# Patient Record
Sex: Male | Born: 1965 | Race: Black or African American | Hispanic: No | Marital: Single | Smoking: Current every day smoker
Health system: Southern US, Community
[De-identification: ages and names within clinical notes are randomized; demographics above are authoritative.]

---

## 2019-09-20 ENCOUNTER — Ambulatory Visit
Admission: EM | Admit: 2019-09-20 | Discharge: 2019-09-20 | Disposition: A | Discharge status: Home / Self Care | Payer: Self-pay | Attending: Physician Assistant | Admitting: Physician Assistant

## 2019-09-20 ENCOUNTER — Ambulatory Visit (INDEPENDENT_AMBULATORY_CARE_PROVIDER_SITE_OTHER): Payer: Self-pay

## 2019-09-20 DIAGNOSIS — M79672 Pain in left foot: Secondary | ICD-10-CM

## 2019-09-20 DIAGNOSIS — M545 Low back pain, unspecified: Secondary | ICD-10-CM

## 2019-09-20 DIAGNOSIS — M542 Cervicalgia: Secondary | ICD-10-CM

## 2019-09-20 LAB — POCT URINALYSIS DIP (MANUAL ENTRY)
Bilirubin, UA: NEGATIVE
Glucose, UA: NEGATIVE mg/dL
Leukocytes, UA: NEGATIVE
Nitrite, UA: NEGATIVE
Protein Ur, POC: NEGATIVE mg/dL
Spec Grav, UA: >=1.03 — AB (ref 1.010–1.025)
Urobilinogen, UA: 1 E.U./dL
pH, UA: 7 (ref 5.0–8.0)

## 2019-09-20 MED ORDER — MELOXICAM 7.5 MG PO TABS: 7.5000 mg | ORAL_TABLET | Freq: Every day | ORAL | 0 refills | Status: DC

## 2019-09-20 MED ORDER — METHOCARBAMOL 500 MG PO TABS: 500.0000 mg | ORAL_TABLET | Freq: Two times a day (BID) | ORAL | 0 refills | Status: DC
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
## 2020-03-06 ENCOUNTER — Other Ambulatory Visit: Payer: Self-pay

## 2020-03-06 ENCOUNTER — Ambulatory Visit (INDEPENDENT_AMBULATORY_CARE_PROVIDER_SITE_OTHER): Payer: Self-pay

## 2020-03-06 ENCOUNTER — Ambulatory Visit
Admission: EM | Admit: 2020-03-06 | Discharge: 2020-03-06 | Disposition: A | Discharge status: Home / Self Care | Payer: Self-pay | Attending: Physician Assistant | Admitting: Physician Assistant

## 2020-03-06 DIAGNOSIS — M25562 Pain in left knee: Secondary | ICD-10-CM

## 2020-03-06 DIAGNOSIS — S83412A Sprain of medial collateral ligament of left knee, initial encounter: Secondary | ICD-10-CM

## 2020-03-06 MED ORDER — DICLOFENAC SODIUM 75 MG PO TBEC: 75.0000 mg | DELAYED_RELEASE_TABLET | Freq: Two times a day (BID) | ORAL | 0 refills | Status: AC

## 2020-03-06 NOTE — ED Provider Notes (Signed)
[facility]    CSN: [id number] Arrival date & time: 03/06/20  1206      History   Chief Complaint Chief Complaint  Patient presents with  . Knee Pain    HPI Gregory Conrad is a 54 y.o. male.   The history is provided by the patient. No language interpreter was used.  Knee Pain Location:  Knee Time since incident:  2 days Injury: no   Knee location:  L knee Pain details:    Quality:  Aching   Radiates to:  Does not radiate   Severity:  Moderate   Onset quality:  Gradual   Timing:  Constant   Progression:  Worsening Chronicity:  New Dislocation: no   Foreign body present:  No foreign bodies Prior injury to area:  Yes Relieved by:  Nothing Worsened by:  Nothing Ineffective treatments:  None tried Pt reports pain in knee after lifting and turning at work    History reviewed. No pertinent past medical history.  There are no problems to display for this patient.   History reviewed. No pertinent surgical history.     Home Medications    Prior to Admission medications   Medication Sig Start Date End Date Taking? Authorizing Provider  diclofenac (VOLTAREN) 75 MG EC tablet Take 1 tablet (75 mg total) by mouth 2 (two) times daily. 03/06/20   Elson Areas, PA-C    Family History No family history on file.  Social History Social History   Tobacco Use  . Smoking status: Current Every Day Smoker  . Smokeless tobacco: Never Used  Substance Use Topics  . Alcohol use: Yes  . Drug use: Not Currently     Allergies   Patient has no known allergies.   Review of Systems Review of Systems  All other systems reviewed and are negative.    Physical Exam Triage Vital Signs ED Triage Vitals  Enc Vitals Group     BP 03/06/20 1241 139/78     Pulse Rate 03/06/20 1241 60     Resp 03/06/20 1241 16     Temp 03/06/20 1241 98.1 F (36.7 C)     Temp Source 03/06/20 1241 Oral     SpO2 03/06/20 1241 96 %     Weight --      Height --      Head  Circumference --      Peak Flow --      Pain Score 03/06/20 1256 9     Pain Loc --      Pain Edu? --      Excl. in [place]? --    No data found.  Updated Vital Signs BP 139/78 (BP Location: Left Arm)   Pulse 60   Temp 98.1 F (36.7 C) (Oral)   Resp 16   SpO2 96%   Visual Acuity Right Eye Distance:   Left Eye Distance:   Bilateral Distance:    Right Eye Near:   Left Eye Near:    Bilateral Near:     Physical Exam Vitals and nursing note reviewed.  Constitutional:      Appearance: He is well-developed.  HENT:     Head: Normocephalic and atraumatic.  Eyes:     Conjunctiva/sclera: Conjunctivae normal.  Cardiovascular:     Rate and Rhythm: Normal rate.  Pulmonary:     Effort: Pulmonary effort is normal.  Abdominal:     Tenderness: There is no abdominal tenderness.  Musculoskeletal:  General: Swelling and tenderness present.     Cervical back: Neck supple.     Comments: No medial or lateral instability  Negative drawer,  Tender medial joint line  Skin:    General: Skin is warm and dry.  Neurological:     General: No focal deficit present.     Mental Status: He is alert.  Psychiatric:        Mood and Affect: Mood normal.      UC Treatments / Results  Labs (all labs ordered are listed, but only abnormal results are displayed) Labs Reviewed - No data to display  EKG   Radiology DG Knee Complete 4 Views Left  Result Date: 03/06/2020 CLINICAL DATA:  Recent work injury.  Left knee pain. EXAM: LEFT KNEE - COMPLETE 4+ VIEW COMPARISON:  None. FINDINGS: The joint spaces are maintained. No acute fracture. No osteochondral lesion. No chondrocalcinosis. No definite joint effusion. IMPRESSION: No acute bony findings or significant degenerative changes. Electronically Signed   By: Marijo Sanes M.D.   On: 03/06/2020 14:08    Procedures Procedures (including critical care time)  Medications Ordered in UC Medications - No data to display  Initial Impression /  Assessment and Plan / UC Course  I have reviewed the triage vital signs and the nursing notes.  Pertinent labs & imaging results that were available during my care of the patient were reviewed by me and considered in my medical decision making (see chart for details).     MDM:  Xray no fracture.  Pt placed in a knee sleeve.  Pt advised to follow up with Orthopaedist for recheck  Final Clinical Impressions(s) / UC Diagnoses   Final diagnoses:  Sprain of medial collateral ligament of left knee, initial encounter     Discharge Instructions     Schedule to see the Orthopaedist for evaluation,  Wear knee brace.  Ice to area of swelling    ED Prescriptions    Medication Sig Dispense Auth. Provider   diclofenac (VOLTAREN) 75 MG EC tablet Take 1 tablet (75 mg total) by mouth 2 (two) times daily. 20 tablet Fransico Meadow, [place]     PDMP not reviewed this encounter.  An After Visit Summary was printed and given to the patient.    Fransico Meadow, [place] 03/06/20 1436

## 2020-03-06 NOTE — Discharge Instructions (Signed)
Schedule to see the Orthopaedist for evaluation,  Wear knee brace.  Ice to area of swelling

## 2020-03-06 NOTE — ED Triage Notes (Signed)
Pt states at work on Monday and stepped wrong and twisted lt ankle. C/o lt ankle swelling and pain.

## 2021-03-20 IMAGING — DX DG KNEE COMPLETE 4+V*L*
4 series · 4 of 4 positions shown · non-contrast
Comparison: None.

CLINICAL DATA: Recent work injury.  Left knee pain.

EXAM:
LEFT KNEE - COMPLETE 4+ VIEW

[knee ap (1 of 3)]
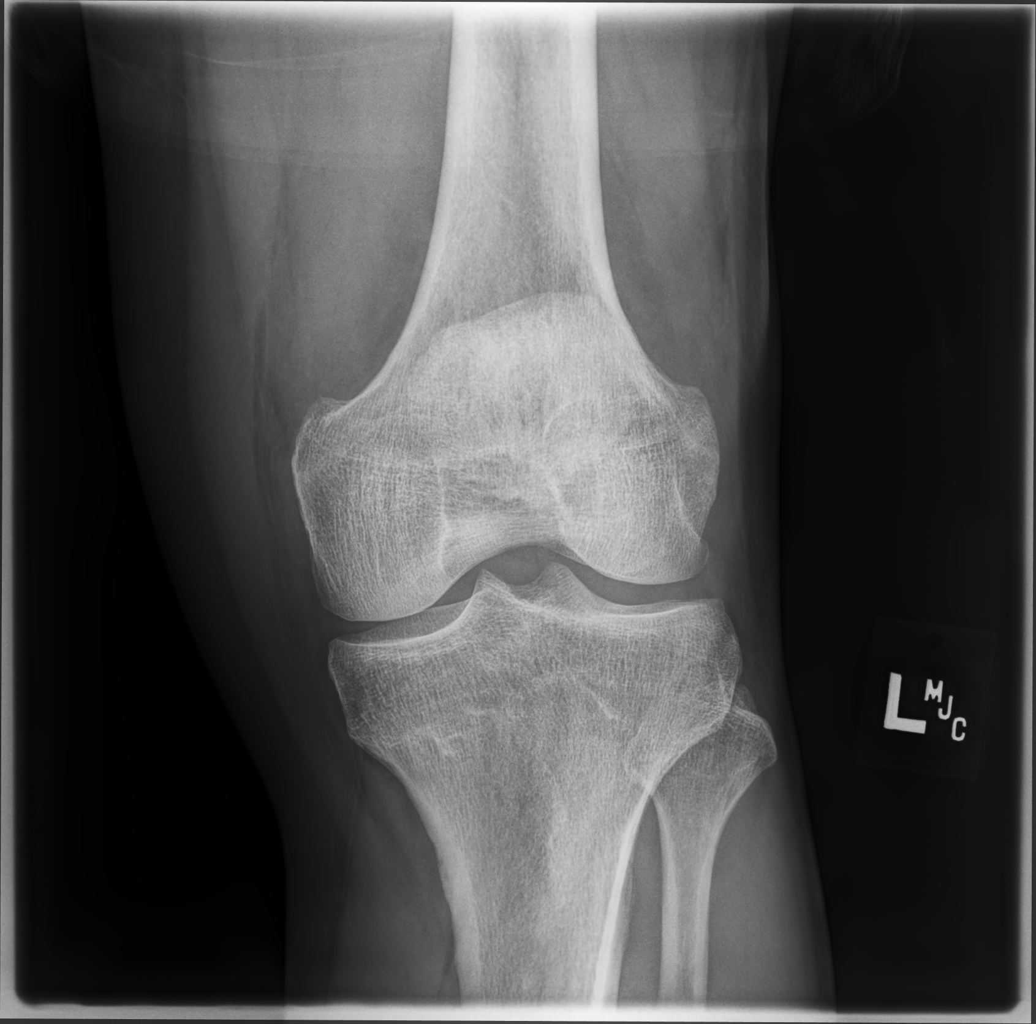

[knee ap (2 of 3)]
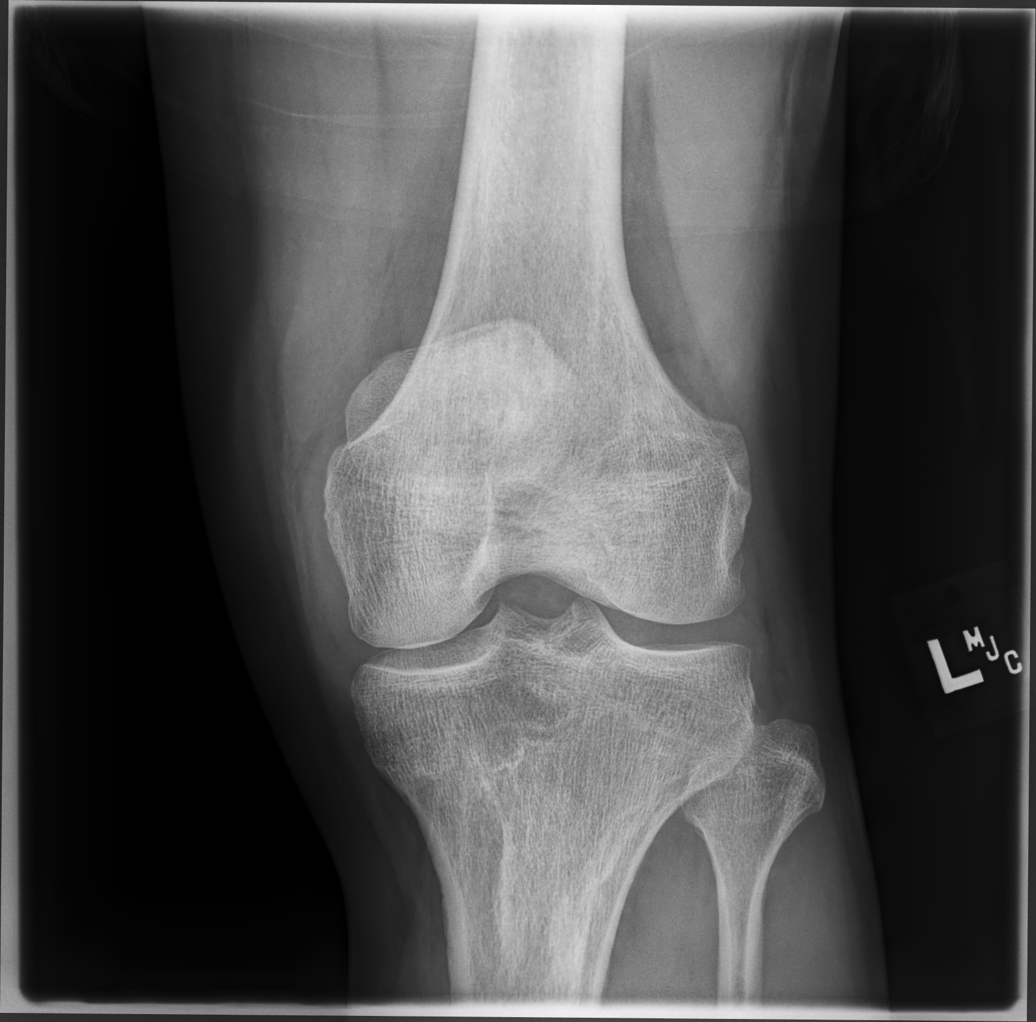

[knee ap (3 of 3)]
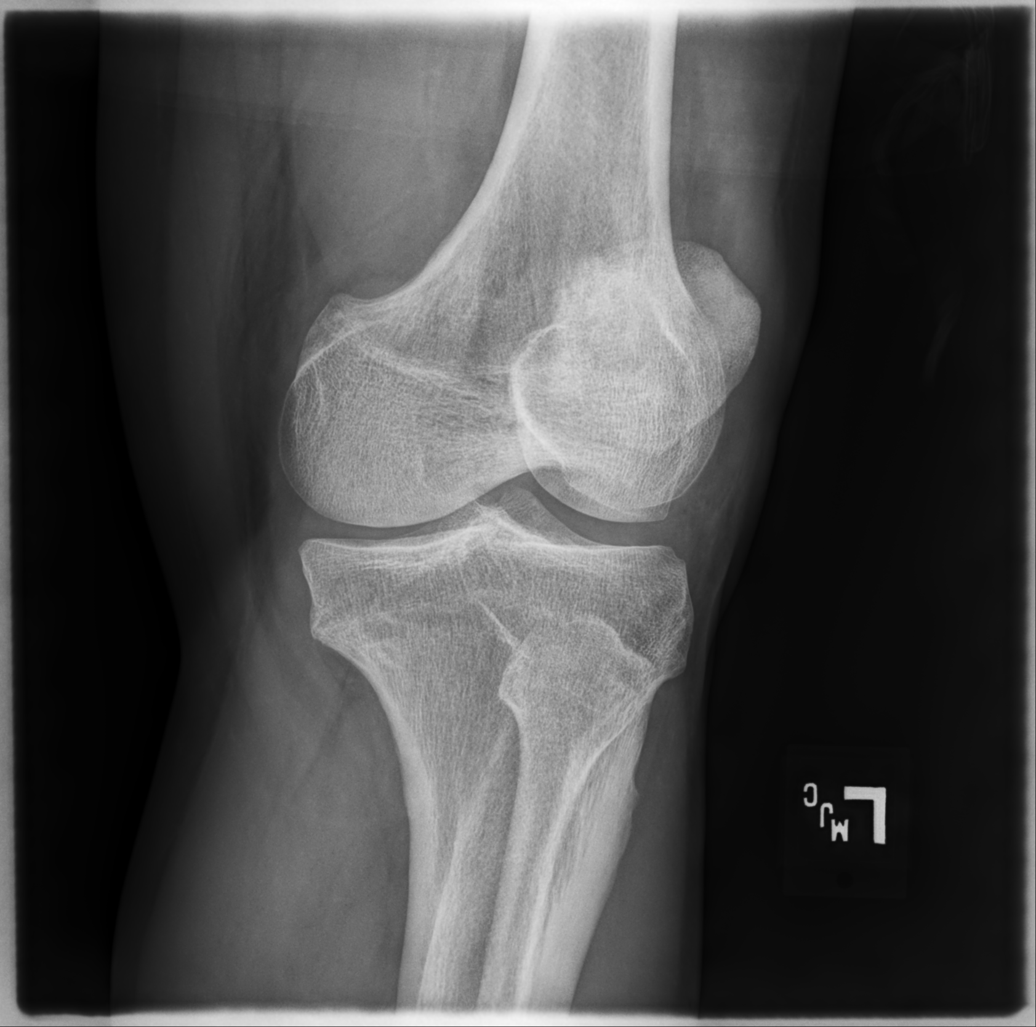

[knee lat]
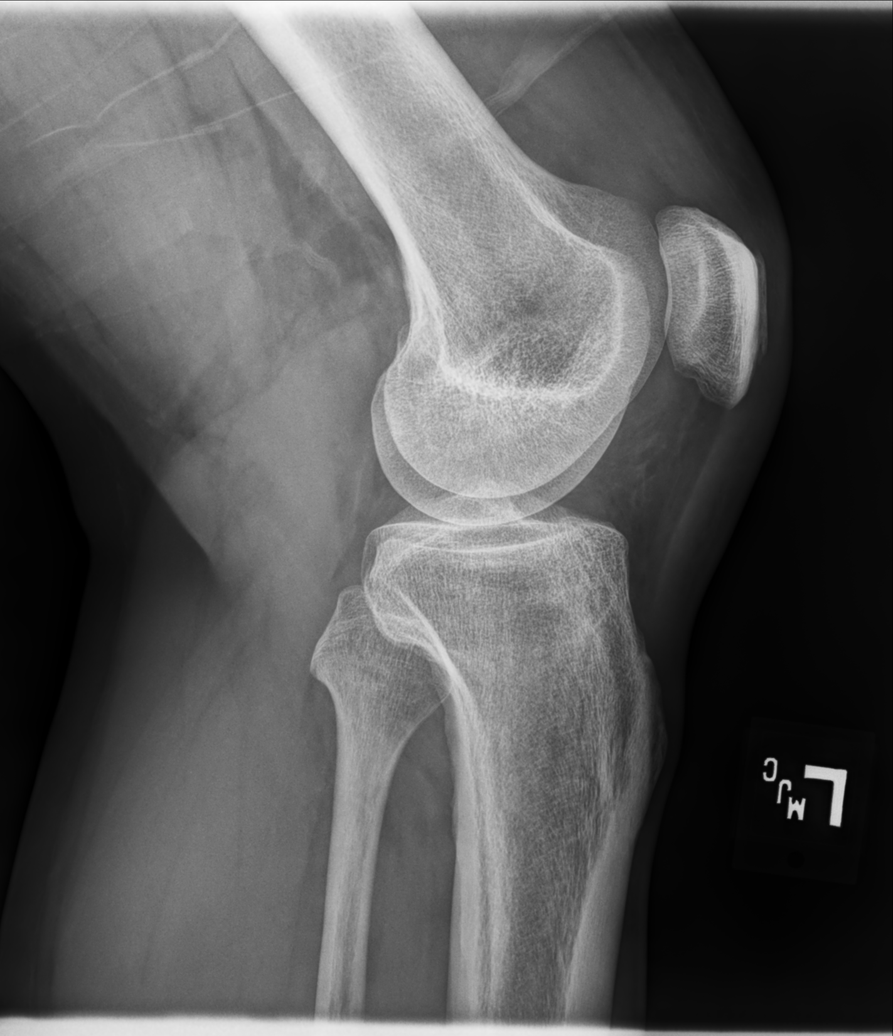

[4 of 4 positions shown; findings below may reference images not displayed]

FINDINGS: The joint spaces are maintained. No acute fracture. No osteochondral
lesion. No chondrocalcinosis. No definite joint effusion.
IMPRESSION: No acute bony findings or significant degenerative changes.
